# Patient Record
Sex: Female | Born: 1990 | Race: Black or African American | Hispanic: No | Marital: Single | Smoking: Never smoker
Health system: Southern US, Community
[De-identification: ages and names within clinical notes are randomized; demographics above are authoritative.]

## PROBLEM LIST (undated history)

## (undated) DIAGNOSIS — Z789 Other specified health status: Secondary | ICD-10-CM

## (undated) HISTORY — PX: NO PAST SURGERIES: SHX2092

---
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
## 2010-10-08 IMAGING — CR DG SHOULDER 3+V*R*
1 series · 4 of 4 positions shown · non-contrast
Comparison: none

REASON FOR EXAM: pain
COMMENTS:   May transport without cardiac monitor

[Series 1: view not recorded · 0.17mm/px · 4 of 4 slices shown]
[im 1/4]
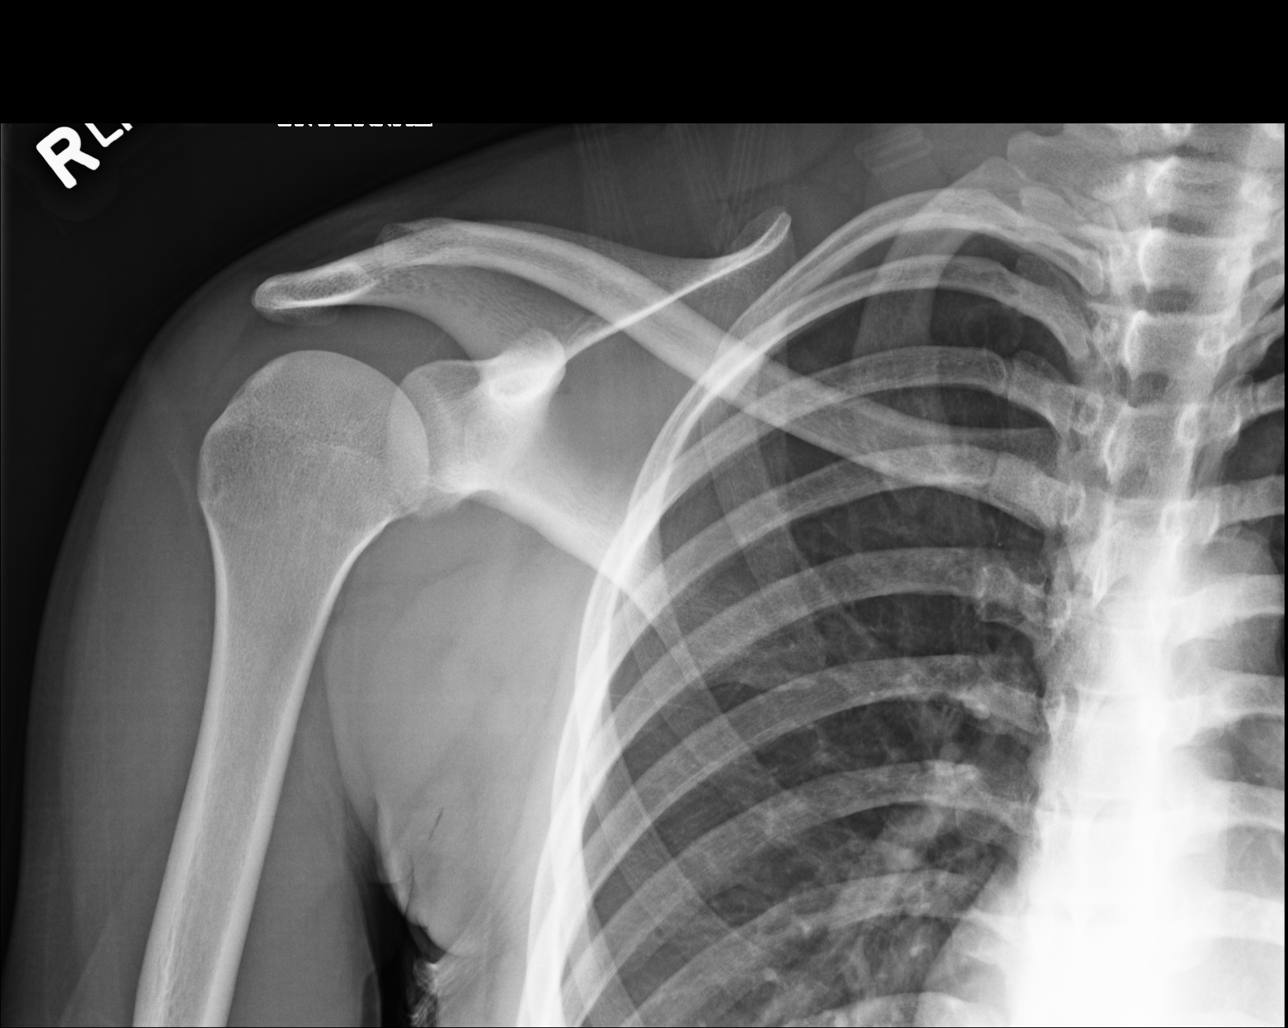
[im 2/4]
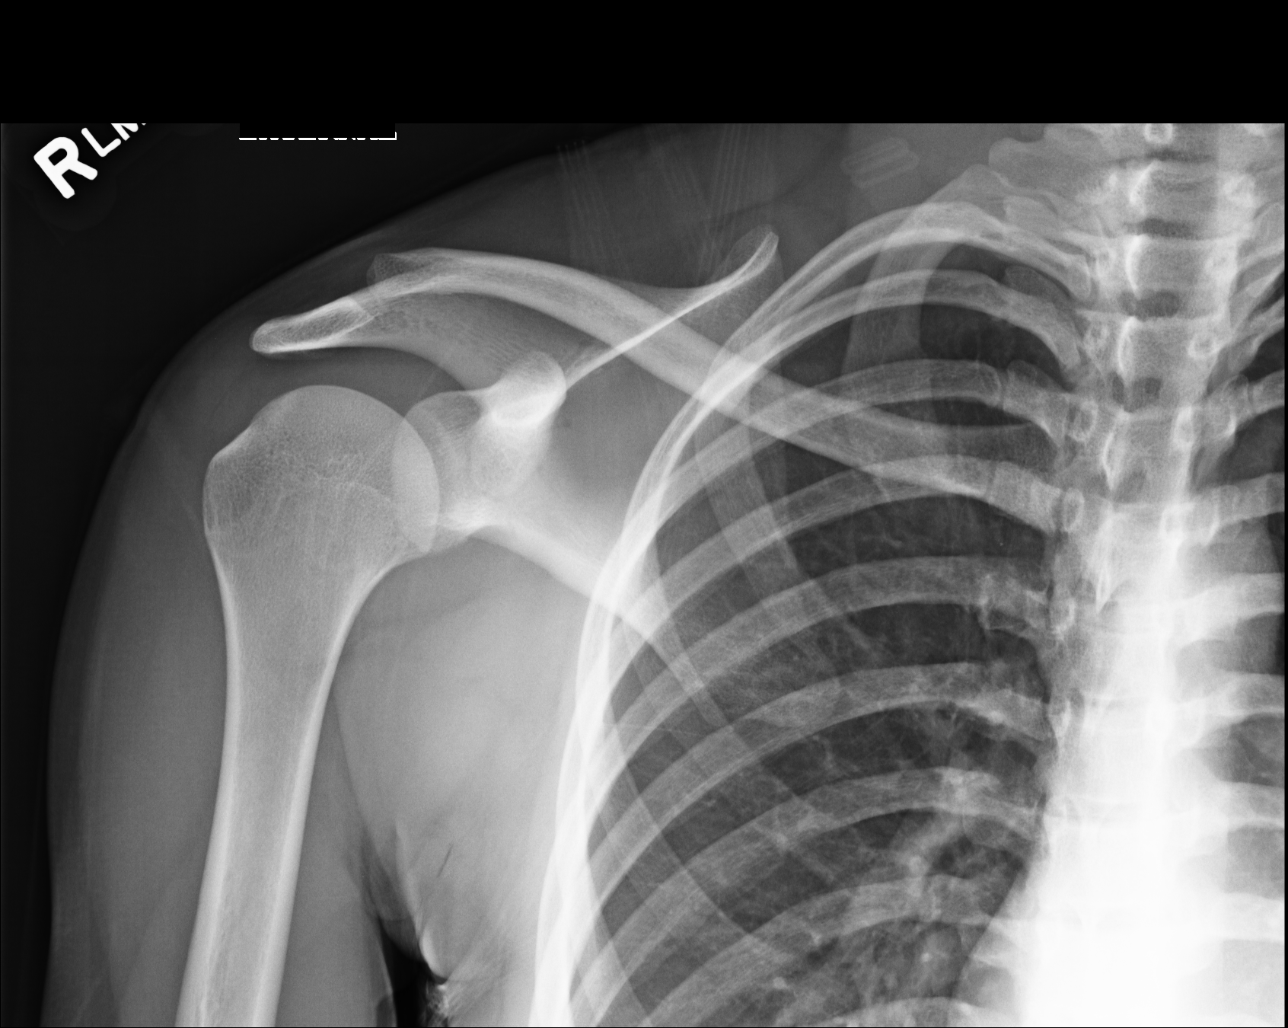
[im 3/4]
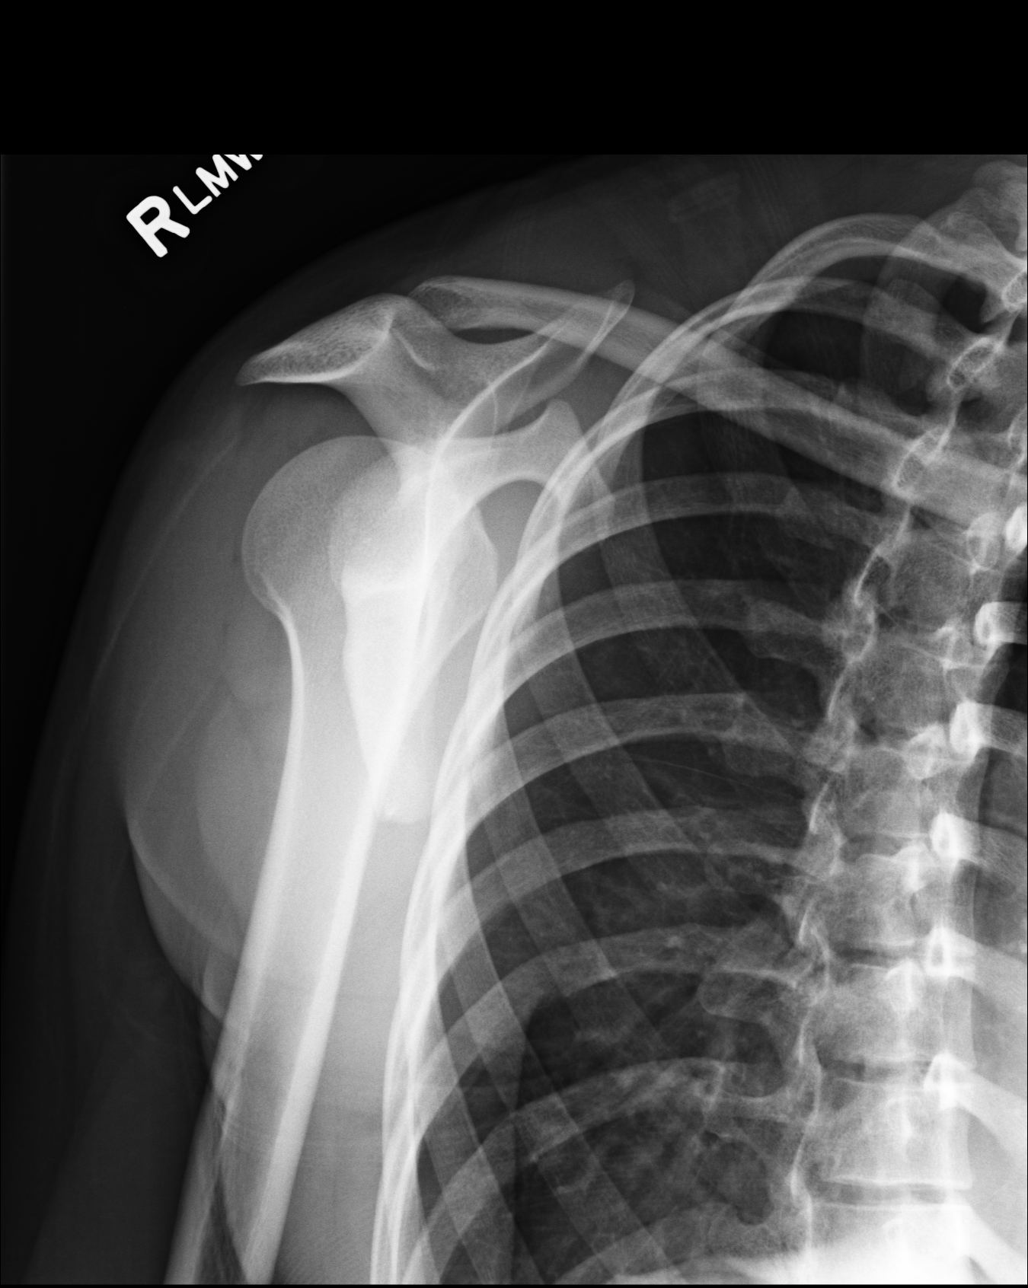
[im 4/4]
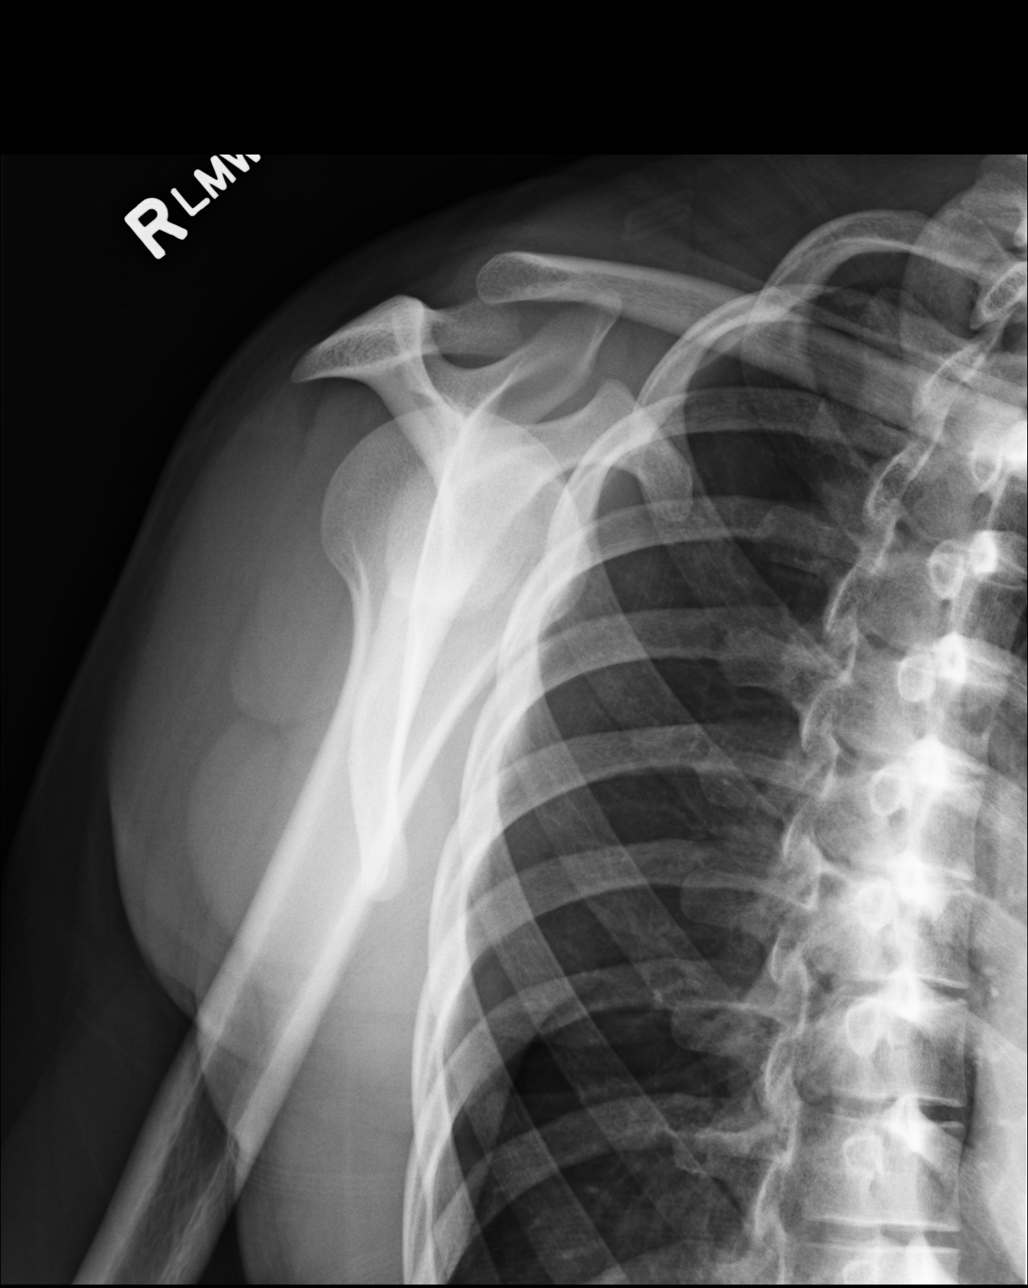

[4 of 4 positions shown; findings below may reference images not displayed]

PROCEDURE:     DXR - DXR SHOULDER RIGHT COMPLETE  - September 22, 2009  [DATE]

RESULT:     No fracture, dislocation or other acute bony abnormality is
identified. The status of the acromioclavicular joint is uncertain. If the
patient has symptomatology related to the acromioclavicular joint, then
bilateral views with and without weight bearing is recommended.
IMPRESSION: 1.  No fracture or dislocation about the shoulder joint is seen.
2.  If the patient is symptomatic at the acromioclavicular joint, then
bilateral views of the acromioclavicular joints with and without weight
bearing would be recommended.

## 2011-03-25 ENCOUNTER — Emergency Department: Payer: Self-pay | Admitting: Emergency Medicine

## 2011-05-05 ENCOUNTER — Emergency Department: Payer: Self-pay | Admitting: Emergency Medicine

## 2012-02-05 ENCOUNTER — Emergency Department: Payer: Self-pay | Admitting: Emergency Medicine

## 2012-07-16 ENCOUNTER — Emergency Department: Payer: Self-pay | Admitting: Emergency Medicine

## 2017-06-12 ENCOUNTER — Emergency Department: Payer: No Typology Code available for payment source

## 2017-06-12 ENCOUNTER — Emergency Department
Admission: EM | Admit: 2017-06-12 | Discharge: 2017-06-12 | Disposition: A | Discharge status: Home / Self Care | Payer: No Typology Code available for payment source | Attending: Emergency Medicine | Admitting: Emergency Medicine

## 2017-06-12 ENCOUNTER — Encounter: Payer: Self-pay | Admitting: Emergency Medicine

## 2017-06-12 ENCOUNTER — Other Ambulatory Visit: Payer: Self-pay

## 2017-06-12 DIAGNOSIS — S32009A Unspecified fracture of unspecified lumbar vertebra, initial encounter for closed fracture: Secondary | ICD-10-CM

## 2017-06-12 DIAGNOSIS — Y939 Activity, unspecified: Secondary | ICD-10-CM | POA: Insufficient documentation

## 2017-06-12 DIAGNOSIS — Y999 Unspecified external cause status: Secondary | ICD-10-CM | POA: Insufficient documentation

## 2017-06-12 DIAGNOSIS — S3992XA Unspecified injury of lower back, initial encounter: Secondary | ICD-10-CM | POA: Diagnosis present

## 2017-06-12 DIAGNOSIS — Y929 Unspecified place or not applicable: Secondary | ICD-10-CM | POA: Diagnosis not present

## 2017-06-12 DIAGNOSIS — S32029A Unspecified fracture of second lumbar vertebra, initial encounter for closed fracture: Secondary | ICD-10-CM | POA: Diagnosis not present

## 2017-06-12 MED ORDER — IBUPROFEN 800 MG PO TABS: 800.0000 mg | ORAL_TABLET | Freq: Three times a day (TID) | ORAL | 0 refills | Status: DC | PRN

## 2017-06-12 MED ORDER — CYCLOBENZAPRINE HCL 10 MG PO TABS: 10.0000 mg | ORAL_TABLET | Freq: Three times a day (TID) | ORAL | 0 refills | Status: DC | PRN

## 2017-06-12 MED ORDER — TRAMADOL HCL 50 MG PO TABS: 50.0000 mg | ORAL_TABLET | Freq: Four times a day (QID) | ORAL | 0 refills | Status: DC | PRN
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
## 2017-06-12 NOTE — ED Notes (Signed)
POC preg test negative.  

## 2017-06-13 LAB — POCT PREGNANCY, URINE: Preg Test, Ur: NEGATIVE

## 2018-05-29 ENCOUNTER — Inpatient Hospital Stay
Admission: EM | Admit: 2018-05-29 | Discharge: 2018-05-31 | DRG: 872 | Disposition: A | Discharge status: Home / Self Care | Payer: Self-pay | Attending: Internal Medicine | Admitting: Internal Medicine

## 2018-05-29 ENCOUNTER — Encounter: Payer: Self-pay | Admitting: Internal Medicine

## 2018-05-29 ENCOUNTER — Other Ambulatory Visit: Payer: Self-pay

## 2018-05-29 DIAGNOSIS — L03312 Cellulitis of back [any part except buttock]: Secondary | ICD-10-CM | POA: Diagnosis present

## 2018-05-29 DIAGNOSIS — D638 Anemia in other chronic diseases classified elsewhere: Secondary | ICD-10-CM | POA: Diagnosis present

## 2018-05-29 DIAGNOSIS — L0501 Pilonidal cyst with abscess: Secondary | ICD-10-CM | POA: Diagnosis present

## 2018-05-29 DIAGNOSIS — Z79899 Other long term (current) drug therapy: Secondary | ICD-10-CM

## 2018-05-29 DIAGNOSIS — A419 Sepsis, unspecified organism: Principal | ICD-10-CM | POA: Diagnosis present

## 2018-05-29 DIAGNOSIS — L0390 Cellulitis, unspecified: Secondary | ICD-10-CM | POA: Diagnosis present

## 2018-05-29 HISTORY — DX: Other specified health status: Z78.9

## 2018-05-29 LAB — CBC
HEMATOCRIT: 34.7 % — AB (ref 36.0–46.0)
HEMOGLOBIN: 11.7 g/dL — AB (ref 12.0–15.0)
MCH: 31 pg (ref 26.0–34.0)
MCHC: 33.7 g/dL (ref 30.0–36.0)
MCV: 92 fL (ref 80.0–100.0)
Platelets: 322 10*3/uL (ref 150–400)
RBC: 3.77 MIL/uL — AB (ref 3.87–5.11)
RDW: 12.6 % (ref 11.5–15.5)
WBC: 15.2 10*3/uL — AB (ref 4.0–10.5)
nRBC: 0 % (ref 0.0–0.2)

## 2018-05-29 LAB — COMPREHENSIVE METABOLIC PANEL
ALBUMIN: 4 g/dL (ref 3.5–5.0)
ALK PHOS: 63 U/L (ref 38–126)
ALT: 34 U/L (ref 0–44)
ANION GAP: 8 (ref 5–15)
AST: 38 U/L (ref 15–41)
BILIRUBIN TOTAL: 0.4 mg/dL (ref 0.3–1.2)
BUN: 8 mg/dL (ref 6–20)
CALCIUM: 9.1 mg/dL (ref 8.9–10.3)
CO2: 26 mmol/L (ref 22–32)
CREATININE: 0.84 mg/dL (ref 0.44–1.00)
Chloride: 105 mmol/L (ref 98–111)
GFR calc Af Amer: >60 mL/min (ref >60)
GFR calc non Af Amer: >60 mL/min (ref >60)
GLUCOSE: 100 mg/dL — AB (ref 70–99)
Potassium: 3.7 mmol/L (ref 3.5–5.1)
Sodium: 139 mmol/L (ref 135–145)
TOTAL PROTEIN: 8.1 g/dL (ref 6.5–8.1)

## 2018-05-29 LAB — CG4 I-STAT (LACTIC ACID): Lactic Acid, Venous: 0.66 mmol/L (ref 0.5–1.9)

## 2018-05-29 MED ORDER — VANCOMYCIN HCL IN DEXTROSE 1-5 GM/200ML-% IV SOLN
1000.0000 mg | Freq: Once | INTRAVENOUS | Status: AC
  Administered 2018-05-29: 1000 mg via INTRAVENOUS
  Filled 2018-05-29: qty 200

## 2018-05-29 MED ORDER — FENTANYL CITRATE (PF) 100 MCG/2ML IJ SOLN
50.0000 ug | Freq: Once | INTRAMUSCULAR | Status: AC
  Administered 2018-05-29: 50 ug via INTRAVENOUS
  Filled 2018-05-29: qty 2

## 2018-05-29 MED ORDER — IBUPROFEN 400 MG PO TABS
800.0000 mg | ORAL_TABLET | Freq: Three times a day (TID) | ORAL | Status: DC | PRN
  Administered 2018-05-30 (×2): 800 mg via ORAL
  Filled 2018-05-29 (×2): qty 2

## 2018-05-29 MED ORDER — ACETAMINOPHEN 325 MG PO TABS
650.0000 mg | ORAL_TABLET | Freq: Four times a day (QID) | ORAL | Status: DC | PRN
  Administered 2018-05-31: 650 mg via ORAL
  Filled 2018-05-29: qty 2

## 2018-05-29 MED ORDER — ENOXAPARIN SODIUM 40 MG/0.4ML ~~LOC~~ SOLN
40.0000 mg | SUBCUTANEOUS | Status: DC
  Administered 2018-05-30: 40 mg via SUBCUTANEOUS
  Filled 2018-05-29: qty 0.4

## 2018-05-29 MED ORDER — CLINDAMYCIN PHOSPHATE 600 MG/50ML IV SOLN
600.0000 mg | Freq: Three times a day (TID) | INTRAVENOUS | Status: DC
  Administered 2018-05-30 – 2018-05-31 (×4): 600 mg via INTRAVENOUS
  Filled 2018-05-29 (×6): qty 50

## 2018-05-29 MED ORDER — TRAMADOL HCL 50 MG PO TABS
50.0000 mg | ORAL_TABLET | Freq: Four times a day (QID) | ORAL | Status: DC | PRN
  Administered 2018-05-30 – 2018-05-31 (×4): 50 mg via ORAL
  Filled 2018-05-29 (×4): qty 1

## 2018-05-29 MED ORDER — ACETAMINOPHEN 650 MG RE SUPP: 650.0000 mg | Freq: Four times a day (QID) | RECTAL | Status: DC | PRN

## 2018-05-29 MED ORDER — SODIUM CHLORIDE 0.9 % IV BOLUS: 1000.0000 mL | Freq: Once | INTRAVENOUS | Status: DC

## 2018-05-29 MED ORDER — ONDANSETRON HCL 4 MG PO TABS: 4.0000 mg | ORAL_TABLET | Freq: Four times a day (QID) | ORAL | Status: DC | PRN

## 2018-05-29 MED ORDER — ACETAMINOPHEN 325 MG PO TABS
650.0000 mg | ORAL_TABLET | Freq: Once | ORAL | Status: AC
  Administered 2018-05-29: 650 mg via ORAL
  Filled 2018-05-29: qty 2

## 2018-05-29 MED ORDER — ONDANSETRON HCL 4 MG/2ML IJ SOLN
4.0000 mg | Freq: Once | INTRAMUSCULAR | Status: AC
  Administered 2018-05-29: 4 mg via INTRAVENOUS
  Filled 2018-05-29: qty 2

## 2018-05-29 MED ORDER — ONDANSETRON HCL 4 MG/2ML IJ SOLN
4.0000 mg | Freq: Four times a day (QID) | INTRAMUSCULAR | Status: DC | PRN
  Administered 2018-05-30 – 2018-05-31 (×2): 4 mg via INTRAVENOUS
  Filled 2018-05-29 (×2): qty 2

## 2018-05-29 MED ORDER — SODIUM CHLORIDE 0.9 % IV SOLN
1.0000 g | Freq: Once | INTRAVENOUS | Status: AC
  Administered 2018-05-29: 1 g via INTRAVENOUS
  Filled 2018-05-29: qty 10

## 2018-05-29 NOTE — ED Notes (Signed)
ED TO INPATIENT HANDOFF REPORT  Name/Age/Gender Cindy Coleman 27 y.o. female  Code Status   Home/SNF/Other home  Chief Complaint abcess  Level of Care/Admitting Diagnosis ED Disposition    ED Disposition Condition Comment   Admit  Hospital Area: [facility] [[id number]]  Level of Care: Med-Surg [16]  Diagnosis: Sepsis [facility]) [[id number]]  Admitting Physician: Oralia Manis [[id number]]  Attending Physician: Oralia Manis [phone]  Estimated length of stay: past midnight tomorrow  Certification:: I certify this patient will need inpatient services for at least 2 midnights  PT Class (Do Not Modify): Inpatient [101]  PT Acc Code (Do Not Modify): Private [1]       Medical History Past Medical History:  Diagnosis Date  . Patient denies medical problems     Allergies No Known Allergies  IV Location/Drains/Wounds Patient Lines/Drains/Airways Status   Active Line/Drains/Airways    Name:   Placement date:   Placement time:   Site:   Days:   Peripheral IV 05/29/18 Left Forearm   05/29/18    2120    Forearm   less than 1   Peripheral IV 05/29/18 Left Antecubital   05/29/18    2121    Antecubital   less than 1          Labs/Imaging Results for orders placed or performed during the hospital encounter of 05/29/18 (from the past 48 hour(s))  CBC     Status: Abnormal   Collection Time: 05/29/18  7:40 PM  Result Value Ref Range   WBC 15.2 (H) 4.0 - 10.5 K/uL   RBC 3.77 (L) 3.87 - 5.11 MIL/uL   Hemoglobin 11.7 (L) 12.0 - 15.0 g/dL   HCT 10.2 (L) 72.5 - 36.6 %   MCV 92.0 80.0 - 100.0 fL   MCH 31.0 26.0 - 34.0 pg   MCHC 33.7 30.0 - 36.0 g/dL   RDW 44.0 34.7 - 42.5 %   Platelets 322 150 - 400 K/uL   nRBC 0.0 0.0 - 0.2 %    Comment: Performed at [facility], [address] [place], [place] [address]  Comprehensive metabolic panel     Status: Abnormal   Collection Time: 05/29/18  7:40 PM  Result Value Ref Range   Sodium 139 135 - 145 mmol/L    Potassium 3.7 3.5 - 5.1 mmol/L   Chloride 105 98 - 111 mmol/L   CO2 26 22 - 32 mmol/L   Glucose, Bld 100 (H) 70 - 99 mg/dL   BUN 8 6 - 20 mg/dL   Creatinine, Ser 7.56 0.44 - 1.00 mg/dL   Calcium 9.1 8.9 - 43.3 mg/dL   Total Protein 8.1 6.5 - 8.1 g/dL   Albumin 4.0 3.5 - 5.0 g/dL   AST 38 15 - 41 U/L   ALT 34 0 - 44 U/L   Alkaline Phosphatase 63 38 - 126 U/L   Total Bilirubin 0.4 0.3 - 1.2 mg/dL   GFR calc non Af Amer >60 >60 mL/min   GFR calc Af Amer >60 >60 mL/min   Anion gap 8 5 - 15    Comment: Performed at [facility], [address] [place], [place] [address]  CG4 I-STAT (Lactic acid)     Status: None   Collection Time: 05/29/18  7:48 PM  Result Value Ref Range   Lactic Acid, Venous 0.66 0.5 - 1.9 mmol/L   No results found.  Pending Labs [employer] (From admission, onward)    Start  Ordered   05/29/18 2032  Blood Culture (routine x 2)  BLOOD CULTURE X 2,   STAT     05/29/18 2031   05/29/18 2032  Urinalysis, Routine w reflex microscopic  ONCE - STAT,   STAT     05/29/18 2031   Signed and Held  HIV antibody (Routine Testing)  Once,   R     Signed and Held   Signed and Held  CBC  (enoxaparin (LOVENOX)    CrCl >/= 30 ml/min)  Once,   R    Comments:  Baseline for enoxaparin therapy IF NOT ALREADY DRAWN.  Notify MD if PLT < 100 K.    Signed and Held   Signed and Held  Creatinine, serum  (enoxaparin (LOVENOX)    CrCl >/= 30 ml/min)  Once,   R    Comments:  Baseline for enoxaparin therapy IF NOT ALREADY DRAWN.    Signed and Held   Signed and Held  Creatinine, serum  (enoxaparin (LOVENOX)    CrCl >/= 30 ml/min)  Weekly,   R    Comments:  while on enoxaparin therapy    Signed and Held   Signed and Held  Basic metabolic panel  Tomorrow morning,   R     Signed and Held   Signed and Held  CBC  Tomorrow morning,   R     Signed and Held          Vitals/Pain Today's Vitals   05/29/18 1933 05/29/18 1938 05/29/18 2131  BP: (!) 144/87  119/75  Pulse:  (!) 110  (!) 112  Resp: (!) 24  (!) 22  Temp: (!) 101.6 F (38.7 C)  100.2 F (37.9 C)  TempSrc: Oral    SpO2: 100%  100%  Weight: 65.8 kg    Height: 5\' 8"  (1.727 m)    PainSc:  10-Worst pain ever     Isolation Precautions No active isolations  Medications Medications  cefTRIAXone (ROCEPHIN) 1 g in sodium chloride 0.9 % 100 mL IVPB (1 g Intravenous New Bag/Given 05/29/18 2123)  vancomycin (VANCOCIN) IVPB 1000 mg/200 mL premix (1,000 mg Intravenous New Bag/Given 05/29/18 2123)  sodium chloride 0.9 % bolus 1,000 mL (has no administration in time range)  acetaminophen (TYLENOL) tablet 650 mg (650 mg Oral Given 05/29/18 1942)  fentaNYL (SUBLIMAZE) injection 50 mcg (50 mcg Intravenous Given 05/29/18 2118)  ondansetron (ZOFRAN) injection 4 mg (4 mg Intravenous Given 05/29/18 2118)    Mobility independent

## 2018-05-29 NOTE — Progress Notes (Signed)
CODE SEPSIS - PHARMACY COMMUNICATION  **Broad Spectrum Antibiotics should be administered within 1 hour of Sepsis diagnosis**  Time Code Sepsis Called/Page Received: 20:31  Antibiotics Ordered: Ceftriaxone, Vancomycin  Time of 1st antibiotic administration: 21:23  Additional action taken by pharmacy:   If necessary, Name of Provider/Nurse Contacted:     Stormy CardKatsoudas,Carolena Fairbank K, [facility] HospitalRPH Clinical Pharmacist  05/29/2018  9:57 PM

## 2018-05-29 NOTE — ED Provider Notes (Signed)
[facility] Emergency Department Provider Note  ____________________________________________  Time seen: Approximately 8:38 PM  I have reviewed the triage vital signs and the nursing notes.   HISTORY  Chief Complaint Abscess   HPI Cindy Coleman is a 27 y.o. female with no significant past medical history who presents for evaluation of abscess and fever.  Patient had a pilonidal abscess that was I&D at [facility] 2 days ago.  She was sent home on Keflex.  She reports that the pain in her back is gotten progressively worse.  The pain is sharp, severe, constant and nonradiating.  Today she started to have fevers at home.  She has had nausea but no vomiting, no diarrhea, no abdominal pain, no chest pain, no URI symptoms, no shortness of breath, no dysuria or hematuria.  PMH None-reviewed  Prior to Admission medications   Medication Sig Start Date End Date Taking? Authorizing Provider  cyclobenzaprine (FLEXERIL) 10 MG tablet Take 1 tablet (10 mg total) by mouth 3 (three) times daily as needed. 06/12/17   Joni Reining, PA-C  ibuprofen (ADVIL,MOTRIN) 800 MG tablet Take 1 tablet (800 mg total) by mouth every 8 (eight) hours as needed for moderate pain. 06/12/17   Joni Reining, PA-C  traMADol (ULTRAM) 50 MG tablet Take 1 tablet (50 mg total) by mouth every 6 (six) hours as needed. 06/12/17 06/12/18  Joni Reining, PA-C    Allergies Patient has no known allergies.  No family history on file.  Social History Social History   Tobacco Use  . Smoking status: Never Smoker  . Smokeless tobacco: Never Used  Substance Use Topics  . Alcohol use: Yes    Comment: occasional  . Drug use: No    Review of Systems  Constitutional: + fever. Eyes: Negative for visual changes. ENT: Negative for sore throat. Neck: No neck pain  Cardiovascular: Negative for chest pain. Respiratory: Negative for shortness of breath. Gastrointestinal: Negative for abdominal pain,  vomiting or diarrhea. Genitourinary: Negative for dysuria. Musculoskeletal: Negative for back pain. Skin: Negative for rash. + abscess Neurological: Negative for headaches, weakness or numbness. Psych: No SI or HI  ____________________________________________   PHYSICAL EXAM:  VITAL SIGNS: ED Triage Vitals  Enc Vitals Group     BP 05/29/18 1933 (!) 144/87     Pulse Rate 05/29/18 1933 (!) 110     Resp 05/29/18 1933 (!) 24     Temp 05/29/18 1933 (!) 101.6 F (38.7 C)     Temp Source 05/29/18 1933 Oral     SpO2 05/29/18 1933 100 %     Weight 05/29/18 1933 145 lb (65.8 kg)     Height 05/29/18 1933 5\' 8"  (1.727 m)     Head Circumference --      Peak Flow --      Pain Score 05/29/18 1938 10     Pain Loc --      Pain Edu? --      Excl. in GC? --     Constitutional: Alert and oriented. Well appearing and in no apparent distress. HEENT:      Head: Normocephalic and atraumatic.         Eyes: Conjunctivae are normal. Sclera is non-icteric.       Mouth/Throat: Mucous membranes are moist.       Neck: Supple with no signs of meningismus. Cardiovascular: Tachycardic with regular rhythm. No murmurs, gallops, or rubs. 2+ symmetrical distal pulses are present in all extremities. No JVD.  Respiratory: Normal respiratory effort. Lungs are clear to auscultation bilaterally. No wheezes, crackles, or rhonchi.  Gastrointestinal: Soft, non tender, and non distended with positive bowel sounds. No rebound or guarding. Back: there is small area which was previously drained, no fluctuance, there is significant induration, erythema, warmth, and tenderness that is spreading from the midline where the abscess was up to the right lower back Musculoskeletal: Nontender with normal range of motion in all extremities. No edema, cyanosis, or erythema of extremities. Neurologic: Normal speech and language. Face is symmetric. Moving all extremities. No gross focal neurologic deficits are appreciated. Skin: Skin  is warm, dry and intact. No rash noted. Psychiatric: Mood and affect are normal. Speech and behavior are normal.  ____________________________________________   LABS (all labs ordered are listed, but only abnormal results are displayed)  Labs Reviewed  CBC - Abnormal; Notable for the following components:      Result Value   WBC 15.2 (*)    RBC 3.77 (*)    Hemoglobin 11.7 (*)    HCT 34.7 (*)    All other components within normal limits  COMPREHENSIVE METABOLIC PANEL - Abnormal; Notable for the following components:   Glucose, Bld 100 (*)    All other components within normal limits  CULTURE, BLOOD (ROUTINE X 2)  CULTURE, BLOOD (ROUTINE X 2)  URINALYSIS, ROUTINE W REFLEX MICROSCOPIC  CG4 I-STAT (LACTIC ACID)  I-STAT BETA HCG BLOOD, ED ([facility], [facility], [facility] ONLY)   ____________________________________________  EKG  none  ____________________________________________  RADIOLogy  none  ____________________________________________   PROCEDURES  Procedure(s) performed: None Procedures Critical Care performed: yes  CRITICAL CARE Performed by: Nita Sickle  ?  Total critical care time: 35 min  Critical care time was exclusive of separately billable procedures and treating other patients.  Critical care was necessary to treat or prevent imminent or life-threatening deterioration.  Critical care was time spent personally by me on the following activities: development of treatment plan with patient and/or surrogate as well as nursing, discussions with consultants, evaluation of patient's response to treatment, examination of patient, obtaining history from patient or surrogate, ordering and performing treatments and interventions, ordering and review of laboratory studies, ordering and review of radiographic studies, pulse oximetry and re-evaluation of patient's condition.  ____________________________________________   INITIAL IMPRESSION / ASSESSMENT AND PLAN / ED  COURSE   27 y.o. female with no significant past medical history who presents for evaluation of abscess and fever.  Patient with a previously drained pilonidal abscess in the emergency department at [facility] 2 days ago who now presents with fever and extending cellulitis of her back. There is no drainable collection at this time on exam.  On arrival patient meets sepsis criteria with a fever of 101.6, tachycardic, tachypneic and white count of 15K.  Patient will be covered broadly with Rocephin and Vanco, will be given fluids and Tylenol.  Blood cultures and lactic are pending.  Patient will be admitted to the hospitalist service.      As part of my medical decision making, I reviewed the following data within the electronic MEDICAL RECORD NUMBER Nursing notes reviewed and incorporated, Labs reviewed , Old chart reviewed, Discussed with admitting physician , Notes from prior ED visits and [place] Controlled Substance Database    Pertinent labs & imaging results that were available during my care of the patient were reviewed by me and considered in my medical decision making (see chart for details).    ____________________________________________   FINAL CLINICAL IMPRESSION(S) /  ED DIAGNOSES  Final diagnoses:  Sepsis without acute organ dysfunction, due to unspecified organism (HCC)  Cellulitis of back except buttock      NEW MEDICATIONS STARTED DURING THIS VISIT:  ED Discharge Orders    None       Note:  This document was prepared using Dragon voice recognition software and may include unintentional dictation errors.    Nita SickleVeronese, [place], MD 05/29/18 2043

## 2018-05-29 NOTE — ED Triage Notes (Addendum)
Pt in with co abscess lower back for a week, [facility]. States started with fever today, states still draining and now feeling weak all over.

## 2018-05-29 NOTE — H&P (Signed)
[facility] -  at [facility]   PATIENT NAME: Cindy Coleman    MR#:  [id number]  DATE OF BIRTH:  05/23/1991  DATE OF ADMISSION:  05/29/2018  PRIMARY CARE PHYSICIAN: Patient, No Pcp Per   REQUESTING/REFERRING PHYSICIAN: Don PerkingVeronese, MD  CHIEF COMPLAINT:   Chief Complaint  Patient presents with  . Abscess    HISTORY OF PRESENT ILLNESS:  Cindy Coleman  is a 27 y.o. female who presents with chief complaint as above.  She presents to the ED with complaint of lower back tenderness.  She states that she went to [facility] 2 days ago and had a pilonidal abscess drained, and was sent home with Keflex.  Since that time her tenderness has not improved.  She states the abscess is no longer draining, but that her tenderness has not improved.  Here in the ED on presentation she is febrile, tachycardic, and has leukocytosis, meeting sepsis criteria.  Hospitalist were called for admission  PAST MEDICAL HISTORY:   Past Medical History:  Diagnosis Date  . Patient denies medical problems      PAST SURGICAL HISTORY:   Past Surgical History:  Procedure Laterality Date  . NO PAST SURGERIES       SOCIAL HISTORY:   Social History   Tobacco Use  . Smoking status: Never Smoker  . Smokeless tobacco: Never Used  Substance Use Topics  . Alcohol use: Yes    Comment: occasional     FAMILY HISTORY:  Family history reviewed and is non-contributory   DRUG ALLERGIES:  No Known Allergies  MEDICATIONS AT HOME:   Prior to Admission medications   Medication Sig Start Date End Date Taking? Authorizing Provider  cyclobenzaprine (FLEXERIL) 10 MG tablet Take 1 tablet (10 mg total) by mouth 3 (three) times daily as needed. 06/12/17   Joni ReiningSmith, Ronald K, PA-C  ibuprofen (ADVIL,MOTRIN) 800 MG tablet Take 1 tablet (800 mg total) by mouth every 8 (eight) hours as needed for moderate pain. 06/12/17   Joni ReiningSmith, Ronald K, PA-C  traMADol (ULTRAM) 50 MG tablet Take 1 tablet (50 mg  total) by mouth every 6 (six) hours as needed. 06/12/17 06/12/18  Joni ReiningSmith, Ronald K, PA-C    REVIEW OF SYSTEMS:  Review of Systems  Constitutional: Negative for chills, fever, malaise/fatigue and weight loss.  HENT: Negative for ear pain, hearing loss and tinnitus.   Eyes: Negative for blurred vision, double vision, pain and redness.  Respiratory: Negative for cough, hemoptysis and shortness of breath.   Cardiovascular: Negative for chest pain, palpitations, orthopnea and leg swelling.  Gastrointestinal: Negative for abdominal pain, constipation, diarrhea, nausea and vomiting.  Genitourinary: Negative for dysuria, frequency and hematuria.  Musculoskeletal: Negative for back pain, joint pain and neck pain.  Skin:       Erythema surrounding drained abscess  Neurological: Negative for dizziness, tremors, focal weakness and weakness.  Endo/Heme/Allergies: Negative for polydipsia. Does not bruise/bleed easily.  Psychiatric/Behavioral: Negative for depression. The patient is not nervous/anxious and does not have insomnia.      VITAL SIGNS:   Vitals:   05/29/18 1933  BP: (!) 144/87  Pulse: (!) 110  Resp: (!) 24  Temp: (!) 101.6 F (38.7 C)  TempSrc: Oral  SpO2: 100%  Weight: 65.8 kg  Height: 5\' 8"  (1.727 m)   Wt Readings from Last 3 Encounters:  05/29/18 65.8 kg  06/12/17 68 kg    PHYSICAL EXAMINATION:  Physical Exam  Vitals reviewed. Constitutional: She is oriented to person, place, and  time. She appears well-developed and well-nourished. No distress.  HENT:  Head: Normocephalic and atraumatic.  Mouth/Throat: Oropharynx is clear and moist.  Eyes: Pupils are equal, round, and reactive to light. Conjunctivae and EOM are normal. No scleral icterus.  Neck: Normal range of motion. Neck supple. No JVD present. No thyromegaly present.  Cardiovascular: Normal rate, regular rhythm and intact distal pulses. Exam reveals no gallop and no friction rub.  No murmur heard. Respiratory:  Effort normal and breath sounds normal. No respiratory distress. She has no wheezes. She has no rales.  GI: Soft. Bowel sounds are normal. She exhibits no distension. There is no tenderness.  Musculoskeletal: Normal range of motion. She exhibits no edema.  No arthritis, no gout  Lymphadenopathy:    She has no cervical adenopathy.  Neurological: She is alert and oriented to person, place, and time. No cranial nerve deficit.  No dysarthria, no aphasia  Skin: Skin is warm and dry. No rash noted. There is erythema (Surrounding abscess site, now drained.  There is persistent induration, though no fluctuation palpated.).  Psychiatric: She has a normal mood and affect. Her behavior is normal. Judgment and thought content normal.    LABORATORY PANEL:   CBC Recent Labs  Lab 05/29/18 1940  WBC 15.2*  HGB 11.7*  HCT 34.7*  PLT 322   ------------------------------------------------------------------------------------------------------------------  Chemistries  Recent Labs  Lab 05/29/18 1940  NA 139  K 3.7  CL 105  CO2 26  GLUCOSE 100*  BUN 8  CREATININE 0.84  CALCIUM 9.1  AST 38  ALT 34  ALKPHOS 63  BILITOT 0.4   ------------------------------------------------------------------------------------------------------------------  Cardiac Enzymes No results for input(s): TROPONINI in the last 168 hours. ------------------------------------------------------------------------------------------------------------------  RADIOLOGY:  No results found.  EKG:   Orders placed or performed during the hospital encounter of 05/29/18  . ED EKG 12-Lead  . ED EKG 12-Lead    IMPRESSION AND PLAN:  Principal Problem:   Sepsis (HCC) -due to cellulitis.  Lactic acid within normal limits.  Blood pressure stable.  IV antibiotics initiated in the ED.  Culture culture sent Active Problems:   Cellulitis -IV antibiotics as above.  She is ordered a dose of vancomycin and Rocephin in the ED.   Given that she was on Keflex outpatient, I will change her to clindamycin for subsequent doses.  Chart review performed and case discussed with ED provider. Labs, imaging and/or ECG reviewed by provider and discussed with patient/family. Management plans discussed with the patient and/or family.  DVT PROPHYLAXIS: SubQ lovenox   GI PROPHYLAXIS:  None  ADMISSION STATUS: Inpatient  CODE STATUS: Full  TOTAL TIME TAKING CARE OF THIS PATIENT: 45 minutes.   Json Koelzer FIELDING 05/29/2018, 8:56 PM  [facility] [place] [facility]  Office  [phone]  CC: Primary care physician; Patient, No Pcp Per  Note:  This document was prepared using Dragon voice recognition software and may include unintentional dictation errors.

## 2018-05-30 LAB — CBC
HCT: 30.1 % — ABNORMAL LOW (ref 36.0–46.0)
Hemoglobin: 9.9 g/dL — ABNORMAL LOW (ref 12.0–15.0)
MCH: 30.9 pg (ref 26.0–34.0)
MCHC: 32.9 g/dL (ref 30.0–36.0)
MCV: 94.1 fL (ref 80.0–100.0)
Platelets: 264 10*3/uL (ref 150–400)
RBC: 3.2 MIL/uL — ABNORMAL LOW (ref 3.87–5.11)
RDW: 12.5 % (ref 11.5–15.5)
WBC: 16.3 10*3/uL — ABNORMAL HIGH (ref 4.0–10.5)
nRBC: 0 % (ref 0.0–0.2)

## 2018-05-30 LAB — BASIC METABOLIC PANEL
Anion gap: 4 — ABNORMAL LOW (ref 5–15)
BUN: 9 mg/dL (ref 6–20)
CO2: 26 mmol/L (ref 22–32)
Calcium: 8.3 mg/dL — ABNORMAL LOW (ref 8.9–10.3)
Chloride: 110 mmol/L (ref 98–111)
Creatinine, Ser: 0.78 mg/dL (ref 0.44–1.00)
GFR calc Af Amer: >60 mL/min (ref >60)
Glucose, Bld: 109 mg/dL — ABNORMAL HIGH (ref 70–99)
POTASSIUM: 3.7 mmol/L (ref 3.5–5.1)
Sodium: 140 mmol/L (ref 135–145)

## 2018-05-30 NOTE — Care Management (Signed)
RNCM following for potential home IV antibiotics.

## 2018-05-30 NOTE — Progress Notes (Signed)
[facility] - [place] at [facility]   PATIENT NAME: Cindy Coleman    MR#:  [id number]  DATE OF BIRTH:  [birth year]  SUBJECTIVE:  CHIEF COMPLAINT:   Chief Complaint  Patient presents with  . Abscess   -Area of redness and induration noted on right posterior flank -High-grade fevers last night  REVIEW OF SYSTEMS:  Review of Systems  Constitutional: Positive for fever. Negative for chills.  HENT: Negative for congestion, ear discharge, hearing loss and nosebleeds.   Eyes: Negative for blurred vision and double vision.  Respiratory: Negative for cough, shortness of breath and wheezing.   Cardiovascular: Negative for chest pain and palpitations.  Gastrointestinal: Negative for abdominal pain, constipation, diarrhea, nausea and vomiting.  Genitourinary: Negative for dysuria.  Musculoskeletal: Positive for myalgias.  Neurological: Negative for dizziness, focal weakness, seizures, weakness and headaches.  Psychiatric/Behavioral: Negative for depression.    DRUG ALLERGIES:  No Known Allergies  VITALS:  Blood pressure 134/76, pulse 89, temperature 98.7 F (37.1 C), temperature source Oral, resp. rate 18, height 5\' 8"  (1.727 m), weight 65.8 kg, last menstrual period 04/29/2018, SpO2 100 %.  PHYSICAL EXAMINATION:  Physical Exam   GENERAL:  27 y.o.-year-old patient lying in the bed with no acute distress.  EYES: Pupils equal, round, reactive to light and accommodation. No scleral icterus. Extraocular muscles intact.  HEENT: Head atraumatic, normocephalic. Oropharynx and nasopharynx clear.  NECK:  Supple, no jugular venous distention. No thyroid enlargement, no tenderness.  LUNGS: Normal breath sounds bilaterally, no wheezing, rales,rhonchi or crepitation. No use of accessory muscles of respiration.  CARDIOVASCULAR: S1, S2 normal. No murmurs, rubs, or gallops.  ABDOMEN: Soft, nontender, nondistended. Bowel sounds present. No organomegaly or mass.  Posterior right  flank, there is an area of induration, firmness and erythema and tenderness noted.  Pilonidal cyst that is healed in the midline noted EXTREMITIES: No pedal edema, cyanosis, or clubbing.  NEUROLOGIC: Cranial nerves II through XII are intact. Muscle strength 5/5 in all extremities. Sensation intact. Gait not checked.  PSYCHIATRIC: The patient is alert and oriented x 3.  SKIN: No obvious rash, lesion, or ulcer.    LABORATORY PANEL:   CBC Recent Labs  Lab 05/30/18 0552  WBC 16.3*  HGB 9.9*  HCT 30.1*  PLT 264   ------------------------------------------------------------------------------------------------------------------  Chemistries  Recent Labs  Lab 05/29/18 1940 05/30/18 0552  NA 139 140  K 3.7 3.7  CL 105 110  CO2 26 26  GLUCOSE 100* 109*  BUN 8 9  CREATININE 0.84 0.78  CALCIUM 9.1 8.3*  AST 38  --   ALT 34  --   ALKPHOS 63  --   BILITOT 0.4  --    ------------------------------------------------------------------------------------------------------------------  Cardiac Enzymes No results for input(s): TROPONINI in the last 168 hours. ------------------------------------------------------------------------------------------------------------------  RADIOLOGY:  No results found.  EKG:  No orders found for this or any previous visit.  ASSESSMENT AND PLAN:   27 year old female with no significant past medical history other than smoking presents to hospital secondary to worsening lower back pain and tenderness  1.  Right flank cellulitis-likely source open pilonidal cyst in the midline.  It has been drained and well-healed now.  Area of tenderness and firmness noted lateral to the spine. -No fluctuant area noted. -Agree with clindamycin for now.  Monitor fevers and WBC.  If improved, will discharge on oral antibiotics tomorrow  2.  Sepsis-with fevers and elevated white count, secondary to above -Monitor WBC  3.  Anemia of chronic  disease-no active  bleeding.  No indication for transfusion at this time  4.  Tobacco use disorder-refused nicotine patch  5.  DVT prophylaxis-Lovenox   All the records are reviewed and case discussed with Care Management/Social Workerr. Management plans discussed with the patient, family and they are in agreement.  CODE STATUS: Full code  TOTAL TIME TAKING CARE OF THIS PATIENT: 38 minutes.   POSSIBLE D/C IN 1 to 2 DAYS, DEPENDING ON CLINICAL CONDITION.   Danilyn Cocke M.D on 05/30/2018 at 1:15 PM  Between 7am to 6pm - Pager - [phone]  After 6pm go to [email or web address] - password [email or web address]  [facility] [place] [facility]  Office  [phone]  CC: Primary care physician; Patient, No Pcp Per

## 2018-05-30 NOTE — Care Management Note (Signed)
Case Management Note  Patient Details  Name: Cindy Coleman MRN: [id number] Date of Birth: 04/15/1991  Subjective/Objective:                  RNCM met with patient and significant other regarding discharge planning. Home health list provided. No PCP.  They will let RNCM know if they would like referral [facility].   Action/Plan:  Indigent clinics provided along with community resources if needed. RNCM will follow.   Expected Discharge Date:                  Expected Discharge Plan:     In-House Referral:     Discharge planning Services  CM Consult, [facility]  Post Acute Care Choice:    Choice offered to:     DME Arranged:    DME Agency:  [place]:    [facility] Agency:     Status of Service:  In process, will continue to follow  If discussed at Long Length of Stay Meetings, dates discussed:    Additional Comments:  Marshell Garfinkel, RN 05/30/2018, 1:39 PM

## 2018-05-31 LAB — BASIC METABOLIC PANEL
Anion gap: 7 (ref 5–15)
BUN: 6 mg/dL (ref 6–20)
CO2: 26 mmol/L (ref 22–32)
Calcium: 8.4 mg/dL — ABNORMAL LOW (ref 8.9–10.3)
Chloride: 106 mmol/L (ref 98–111)
Creatinine, Ser: 0.94 mg/dL (ref 0.44–1.00)
GFR calc Af Amer: >60 mL/min (ref >60)
GFR calc non Af Amer: >60 mL/min (ref >60)
Glucose, Bld: 107 mg/dL — ABNORMAL HIGH (ref 70–99)
Potassium: 3.5 mmol/L (ref 3.5–5.1)
Sodium: 139 mmol/L (ref 135–145)

## 2018-05-31 LAB — CBC
HCT: 30 % — ABNORMAL LOW (ref 36.0–46.0)
Hemoglobin: 9.9 g/dL — ABNORMAL LOW (ref 12.0–15.0)
MCH: 30.7 pg (ref 26.0–34.0)
MCHC: 33 g/dL (ref 30.0–36.0)
MCV: 93.2 fL (ref 80.0–100.0)
Platelets: 293 10*3/uL (ref 150–400)
RBC: 3.22 MIL/uL — ABNORMAL LOW (ref 3.87–5.11)
RDW: 12.4 % (ref 11.5–15.5)
WBC: 13.1 10*3/uL — ABNORMAL HIGH (ref 4.0–10.5)
nRBC: 0 % (ref 0.0–0.2)

## 2018-05-31 LAB — HIV ANTIBODY (ROUTINE TESTING W REFLEX): HIV Screen 4th Generation wRfx: NONREACTIVE

## 2018-05-31 MED ORDER — RISAQUAD PO CAPS
1.0000 | ORAL_CAPSULE | Freq: Every day | ORAL | Status: DC
  Administered 2018-05-31: 1 via ORAL
  Filled 2018-05-31: qty 1

## 2018-05-31 MED ORDER — TRAMADOL HCL 50 MG PO TABS: 50.0000 mg | ORAL_TABLET | Freq: Four times a day (QID) | ORAL | 0 refills | Status: AC | PRN

## 2018-05-31 MED ORDER — BUTALBITAL-APAP-CAFFEINE 50-325-40 MG PO TABS
1.0000 | ORAL_TABLET | Freq: Once | ORAL | Status: AC
  Administered 2018-05-31: 1 via ORAL
  Filled 2018-05-31: qty 1

## 2018-05-31 MED ORDER — CLINDAMYCIN HCL 150 MG PO CAPS
300.0000 mg | ORAL_CAPSULE | Freq: Four times a day (QID) | ORAL | Status: DC
  Administered 2018-05-31: 300 mg via ORAL
  Filled 2018-05-31 (×2): qty 2

## 2018-05-31 MED ORDER — CLINDAMYCIN HCL 300 MG PO CAPS: 300.0000 mg | ORAL_CAPSULE | Freq: Four times a day (QID) | ORAL | 0 refills | Status: AC

## 2018-05-31 MED ORDER — RISAQUAD PO CAPS: 1.0000 | ORAL_CAPSULE | Freq: Every day | ORAL | 0 refills | Status: AC

## 2018-05-31 MED ORDER — IBUPROFEN 200 MG PO TABS: 800.0000 mg | ORAL_TABLET | ORAL | 0 refills | Status: AC | PRN

## 2018-05-31 NOTE — Progress Notes (Signed)
Pt expressing anger; verbally agressive because her "pain medication is late" Earlier on shift; during report, pt had c/o pain, I explained to pt that the previous nurse gave her tramadol at 1803 and she needed to give the medication time to work. Pt then asked me when her next dose for Tramadol was, I explained that Tramadol is PRN and it was Q 6 hours, it was due at 0003 as needed; pt stated "okay". At midnight, I checked on pt and pt was asleep, however, when NT went to get VS around 0105 pt stated that she needed to see me because she needed her pain medication and she had a fever. Temp was 100 oral. When I went to the room, pt was angry and verbally abusive towards me. She said "you knew I stay in pain....don't compare me to other patients...is SolicitorTiffany your director because I'll make sure to tell her about this...'' She went on and on until I said, I do not wake people up to give pain medications as needed and I am not technically supposed to give you Tylenol for a fever of 100, the orders read for fever >= to 101 but I have your Tramadol and I will also give you Tylenol, pt then said that I was trying to argue with her. [facility] has been notified.

## 2018-05-31 NOTE — Care Management (Signed)
Patient has transitioned to PO antibiotic per RN.  Patient has asked RNCM to send referral to [facility] and [facility] which I have done.  No other anticipated RNCM needs.

## 2018-05-31 NOTE — Discharge Summary (Signed)
[facility] - [place] at [facility]   PATIENT NAME: Cindy Coleman    MR#:  [id number]  DATE OF BIRTH:  12/02/1990  DATE OF ADMISSION:  05/29/2018   ADMITTING PHYSICIAN: Oralia Manisavid Willis, MD  DATE OF DISCHARGE: 05/31/18  PRIMARY CARE PHYSICIAN: Patient, No Pcp Per   ADMISSION DIAGNOSIS:   Cellulitis of back except buttock [L03.312] Sepsis without acute organ dysfunction, due to unspecified organism (HCC) [A41.9] Sepsis (HCC) [A41.9]  DISCHARGE DIAGNOSIS:   Principal Problem:   Sepsis (HCC) Active Problems:   Cellulitis   SECONDARY DIAGNOSIS:   Past Medical History:  Diagnosis Date  . Patient denies medical problems     HOSPITAL COURSE:   27 year old female with no significant past medical history other than smoking presents to hospital secondary to worsening lower back pain and tenderness  1.  Right flank cellulitis-likely source open pilonidal cyst in the midline.  It has been drained and well-healed now.  Area of tenderness and firmness noted lateral to the spine. -No fluctuant area noted. -improving with clindamycin for now.   - no further fever and improved WBC.   Change to oral clinda and discharge today  2.  Sepsis- secondary to above -resolved now  3.  Anemia of chronic disease-no active bleeding.  No indication for transfusion at this time  4.  Tobacco use disorder-refused nicotine patch   Discharge today   DISCHARGE CONDITIONS:   Guarded ' CONSULTS OBTAINED:   none  DRUG ALLERGIES:   No Known Allergies DISCHARGE MEDICATIONS:   Allergies as of 05/31/2018   No Known Allergies     Medication List    STOP taking these medications   cephALEXin 500 MG capsule Commonly known as:  KEFLEX     TAKE these medications   acidophilus Caps capsule Take 1 capsule by mouth daily. Start taking on:  06/01/2018   clindamycin 300 MG capsule Commonly known as:  CLEOCIN Take 1 capsule (300 mg total) by mouth every 6 (six) hours  for 10 days.   ibuprofen 200 MG tablet Commonly known as:  ADVIL,MOTRIN Take 4 tablets (800 mg total) by mouth every 4 (four) hours as needed for fever or mild pain.   traMADol 50 MG tablet Commonly known as:  ULTRAM Take 1 tablet (50 mg total) by mouth every 6 (six) hours as needed for severe pain. What changed:  reasons to take this        DISCHARGE INSTRUCTIONS:   1. PCP f/u in 1 week  DIET:   Regular diet  ACTIVITY:   Activity as tolerated and no driving for today  OXYGEN:   Home Oxygen: No.  Oxygen Delivery: room air  DISCHARGE LOCATION:   home   If you experience worsening of your admission symptoms, develop shortness of breath, life threatening emergency, suicidal or homicidal thoughts you must seek medical attention immediately by calling 911 or calling your MD immediately  if symptoms less severe.  You Must read complete instructions/literature along with all the possible adverse reactions/side effects for all the Medicines you take and that have been prescribed to you. Take any new Medicines after you have completely understood and accpet all the possible adverse reactions/side effects.   Please note  You were cared for by a hospitalist during your hospital stay. If you have any questions about your discharge medications or the care you received while you were in the hospital after you are discharged, you can call the unit and asked to speak with  the hospitalist on call if the hospitalist that took care of you is not available. Once you are discharged, your primary care physician will handle any further medical issues. Please note that NO REFILLS for any discharge medications will be authorized once you are discharged, as it is imperative that you return to your primary care physician (or establish a relationship with a primary care physician if you do not have one) for your aftercare needs so that they can reassess your need for medications and monitor your lab  values.    On the day of Discharge:  VITAL SIGNS:   Blood pressure 122/79, pulse 87, temperature 98.3 F (36.8 C), temperature source Oral, resp. rate 18, height 5\' 8"  (1.727 m), weight 65.8 kg, last menstrual period 04/29/2018, SpO2 100 %.  PHYSICAL EXAMINATION:    GENERAL:  27 y.o.-year-old patient lying in the bed with no acute distress.  EYES: Pupils equal, round, reactive to light and accommodation. No scleral icterus. Extraocular muscles intact.  HEENT: Head atraumatic, normocephalic. Oropharynx and nasopharynx clear.  NECK:  Supple, no jugular venous distention. No thyroid enlargement, no tenderness.  LUNGS: Normal breath sounds bilaterally, no wheezing, rales,rhonchi or crepitation. No use of accessory muscles of respiration.  CARDIOVASCULAR: S1, S2 normal. No murmurs, rubs, or gallops.  ABDOMEN: Soft, nontender, nondistended. Bowel sounds present. No organomegaly or mass.  Posterior right flank, there is an area of induration, firmness and erythema and tenderness noted.  Pilonidal cyst that is healed in the midline noted EXTREMITIES: No pedal edema, cyanosis, or clubbing.  NEUROLOGIC: Cranial nerves II through XII are intact. Muscle strength 5/5 in all extremities. Sensation intact. Gait not checked.  PSYCHIATRIC: The patient is alert and oriented x 3.  SKIN: No obvious rash, lesion, or ulcer.   DATA REVIEW:   CBC Recent Labs  Lab 05/31/18 0348  WBC 13.1*  HGB 9.9*  HCT 30.0*  PLT 293    Chemistries  Recent Labs  Lab 05/29/18 1940  05/31/18 0348  NA 139   < > 139  K 3.7   < > 3.5  CL 105   < > 106  CO2 26   < > 26  GLUCOSE 100*   < > 107*  BUN 8   < > 6  CREATININE 0.84   < > 0.94  CALCIUM 9.1   < > 8.4*  AST 38  --   --   ALT 34  --   --   ALKPHOS 63  --   --   BILITOT 0.4  --   --    < > = values in this interval not displayed.     Microbiology Results  Results for orders placed or performed during the hospital encounter of 05/29/18  Blood  Culture (routine x 2)     Status: None (Preliminary result)   Collection Time: 05/29/18  7:40 PM  Result Value Ref Range Status   Specimen Description BLOOD RIGHT ANTECUBITAL  Final   Special Requests   Final    BOTTLES DRAWN AEROBIC AND ANAEROBIC Blood Culture results may not be optimal due to an excessive volume of blood received in culture bottles   Culture   Final    NO GROWTH 2 DAYS Performed at [facility], [address] [place], [place] [address]    Report Status PENDING  Incomplete  Blood Culture (routine x 2)     Status: None (Preliminary result)   Collection Time: 05/29/18  9:24 PM  Result  Value Ref Range Status   Specimen Description BLOOD LEFT ANTECUBITAL  Final   Special Requests   Final    BOTTLES DRAWN AEROBIC AND ANAEROBIC Blood Culture results may not be optimal due to an excessive volume of blood received in culture bottles   Culture   Final    NO GROWTH 2 DAYS Performed at [facility], [address] [place], [place] [address]    Report Status PENDING  Incomplete    RADIOLOGY:  No results found.   Management plans discussed with the patient, family and they are in agreement.  CODE STATUS:     Code Status Orders  (From admission, onward)         Start     Ordered   05/29/18 2323  Full code  Continuous     05/29/18 2323        Code Status History    This patient has a current code status but no historical code status.      TOTAL TIME TAKING CARE OF THIS PATIENT: 38 minutes.    Enid Baas M.D on 05/31/2018 at 11:47 AM  Between 7am to 6pm - Pager - [phone]  After 6pm go to [email or web address] - Social research officer, government  [facility] [place] [facility]  Office  [phone]  CC: Primary care physician; Patient, No Pcp Per   Note: This dictation was prepared with Dragon dictation along with smaller phrase technology. Any transcriptional errors that result from this process are unintentional.

## 2018-05-31 NOTE — Progress Notes (Signed)
    Ms. Marland Kitchenja Quinn-Cuffie has been at [facility] caring for her friend Whitman Heroierra Shirkey who was admitted to the [facility] on 05/29/2018 for an acute medical condition and is being Discharged on  05/31/2018 . Marland Kitchen. So please excuse her from work for the above  Days.   Call Enid Baasadhika Azyah Flett  MD, [facility] at  [phone] with questions.  Enid BaasKALISETTI,Quinnetta Roepke M.D on 05/31/2018,at 11:45 AM  [facility] [address], [place] [id number]

## 2018-06-03 LAB — CULTURE, BLOOD (ROUTINE X 2)
CULTURE: NO GROWTH
CULTURE: NO GROWTH

## 2018-06-05 ENCOUNTER — Telehealth: Payer: Self-pay | Admitting: Adult Health Nurse Practitioner

## 2018-06-05 NOTE — Telephone Encounter (Signed)
Called patient to go over eligibility and she said she would call back at a better time.

## 2018-06-06 ENCOUNTER — Telehealth: Payer: Self-pay

## 2018-06-06 NOTE — Telephone Encounter (Signed)
-----   Message from [name] sent at 06/05/2018  5:06 PM EST ----- Regarding: RE: new referral Called patient on 12/10 to go over eligibility and she said she would call back at a better time. ----- Message ----- From: Smiley Housemanarter, Lorrie D Sent: 06/04/2018  12:47 PM EST To: Rodney LangtonBod Admin Subject: FW: new referral                                 ----- Message ----- From: Collie SiadJohnson, Angela, RN Sent: 05/31/2018  11:36 AM EST To: Neila GearLorrie D Carter, Betty J Kluttz Subject: new referral

## 2018-06-07 ENCOUNTER — Telehealth: Payer: Self-pay | Admitting: Pharmacy Technician

## 2018-06-07 NOTE — Telephone Encounter (Signed)
Provided patient with new patient packet to obtain [facility] services.  [facility] understands that [facility] must receive current financial documentation in order to determine eligibility and obtain on-going medication assistance.  Sherilyn DacostaBetty J. Nicolaas Savo Care Manager [facility]

## 2022-07-28 ENCOUNTER — Emergency Department: Payer: Self-pay

## 2022-07-28 ENCOUNTER — Encounter: Payer: Self-pay | Admitting: Emergency Medicine

## 2022-07-28 ENCOUNTER — Emergency Department
Admission: EM | Admit: 2022-07-28 | Discharge: 2022-07-28 | Disposition: A | Discharge status: Home / Self Care | Payer: Self-pay | Attending: Emergency Medicine | Admitting: Emergency Medicine

## 2022-07-28 ENCOUNTER — Other Ambulatory Visit: Payer: Self-pay

## 2022-07-28 DIAGNOSIS — J101 Influenza due to other identified influenza virus with other respiratory manifestations: Secondary | ICD-10-CM | POA: Insufficient documentation

## 2022-07-28 DIAGNOSIS — Z20822 Contact with and (suspected) exposure to covid-19: Secondary | ICD-10-CM | POA: Insufficient documentation

## 2022-07-28 LAB — CBC
HCT: 42 % (ref 36.0–46.0)
Hemoglobin: 14.3 g/dL (ref 12.0–15.0)
MCH: 30 pg (ref 26.0–34.0)
MCHC: 34 g/dL (ref 30.0–36.0)
MCV: 88.1 fL (ref 80.0–100.0)
Platelets: 197 10*3/uL (ref 150–400)
RBC: 4.77 MIL/uL (ref 3.87–5.11)
RDW: 12.6 % (ref 11.5–15.5)
WBC: 2.9 10*3/uL — ABNORMAL LOW (ref 4.0–10.5)
nRBC: 0 % (ref 0.0–0.2)

## 2022-07-28 LAB — RESP PANEL BY RT-PCR (RSV, FLU A&B, COVID)  RVPGX2
Influenza A by PCR: NEGATIVE
Influenza B by PCR: POSITIVE — AB
Resp Syncytial Virus by PCR: NEGATIVE
SARS Coronavirus 2 by RT PCR: NEGATIVE

## 2022-07-28 LAB — TROPONIN I (HIGH SENSITIVITY): Troponin I (High Sensitivity): 4 ng/L (ref <18)

## 2022-07-28 LAB — POC URINE PREG, ED
Preg Test, Ur: NEGATIVE
Preg Test, Ur: NEGATIVE

## 2022-07-28 LAB — BASIC METABOLIC PANEL
Anion gap: 12 (ref 5–15)
BUN: 10 mg/dL (ref 6–20)
CO2: 23 mmol/L (ref 22–32)
Calcium: 8.9 mg/dL (ref 8.9–10.3)
Chloride: 100 mmol/L (ref 98–111)
Creatinine, Ser: 0.81 mg/dL (ref 0.44–1.00)
GFR, Estimated: >60 mL/min (ref >60)
Glucose, Bld: 109 mg/dL — ABNORMAL HIGH (ref 70–99)
Potassium: 3.2 mmol/L — ABNORMAL LOW (ref 3.5–5.1)
Sodium: 135 mmol/L (ref 135–145)

## 2022-07-28 MED ORDER — SODIUM CHLORIDE 0.9 % IV BOLUS
1000.0000 mL | Freq: Once | INTRAVENOUS | Status: AC
  Administered 2022-07-28: 1000 mL via INTRAVENOUS

## 2022-07-28 MED ORDER — BENZONATATE 100 MG PO CAPS: 100.0000 mg | ORAL_CAPSULE | Freq: Three times a day (TID) | ORAL | 0 refills | Status: AC | PRN

## 2022-07-28 MED ORDER — ONDANSETRON 4 MG PO TBDP: 4.0000 mg | ORAL_TABLET | Freq: Three times a day (TID) | ORAL | 0 refills | Status: AC | PRN

## 2022-07-28 MED ORDER — ONDANSETRON HCL 4 MG/2ML IJ SOLN
4.0000 mg | Freq: Once | INTRAMUSCULAR | Status: AC
  Administered 2022-07-28: 4 mg via INTRAVENOUS
  Filled 2022-07-28: qty 2

## 2022-07-28 NOTE — ED Provider Notes (Signed)
[facility] Provider Note    Event Date/Time   First MD Initiated Contact with Patient 07/28/22 [phone]     (approximate)   History   Generalized Body Aches and Shortness of Breath   HPI  Cindy Coleman is a 32 y.o. female with no significant past medical history even though her chart shows a history of sepsis presents emergency department complaining of flulike symptoms.  Patient's body aches, weakness, some shortness of breath and chest pain along with fever.  Decreased appetite cough since last Friday.  States she started feeling bad Friday morning.  Felt fine on Thursday.  Today she got concerned as she got dizzy while in the shower.  Is that she fell like she could not breathe.  She denies any recent travel, no hormone replacement therapy, however patient is a smoker but only smokes marijuana.  States she has not had marijuana in over a week      Physical Exam   Triage Vital Signs: ED Triage Vitals  Enc Vitals Group     BP 07/28/22 0850 (!) 149/102     Pulse Rate 07/28/22 0850 (!) 102     Resp 07/28/22 0850 18     Temp 07/28/22 0850 98 F (36.7 C)     Temp Source 07/28/22 0850 Oral     SpO2 07/28/22 0850 100 %     Weight 07/28/22 0912 145 lb 1 oz (65.8 kg)     Height 07/28/22 0912 5\' 8"  (1.727 m)     Head Circumference --      Peak Flow --      Pain Score 07/28/22 0850 5     Pain Loc --      Pain Edu? --      Excl. in [place]? --     Most recent vital signs: Vitals:   07/28/22 0850  BP: (!) 149/102  Pulse: (!) 102  Resp: 18  Temp: 98 F (36.7 C)  SpO2: 100%     General: Awake, no distress.   CV:  Good peripheral perfusion.  Tachycardic and regular rhythm Resp:  Normal effort. Lungs cta Abd:  No distention.  Nontender Other:      ED Results / Procedures / Treatments   Labs (all labs ordered are listed, but only abnormal results are displayed) Labs Reviewed  RESP PANEL BY RT-PCR (RSV, FLU A&B, COVID)  RVPGX2 - Abnormal; Notable  for the following components:      Result Value   Influenza B by PCR POSITIVE (*)    All other components within normal limits  BASIC METABOLIC PANEL - Abnormal; Notable for the following components:   Potassium 3.2 (*)    Glucose, Bld 109 (*)    All other components within normal limits  CBC - Abnormal; Notable for the following components:   WBC 2.9 (*)    All other components within normal limits  POC URINE PREG, ED  POC URINE PREG, ED  TROPONIN I (HIGH SENSITIVITY)     EKG  EKG   RADIOLOGY Chest x-ray    PROCEDURES:   Procedures   MEDICATIONS ORDERED IN ED: Medications  sodium chloride 0.9 % bolus 1,000 mL (0 mLs Intravenous Stopped 07/28/22 1052)  ondansetron (ZOFRAN) injection 4 mg (4 mg Intravenous Given 07/28/22 0959)     IMPRESSION / MDM / ASSESSMENT AND PLAN / ED COURSE  I reviewed the triage vital signs and the nursing notes.  Differential diagnosis includes, but is not limited to, COVID, influenza, RSV, CAP, sepsis, dehydration  Patient's presentation is most consistent with acute complicated illness / injury requiring diagnostic workup.   Feel that sepsis is less likely at this time as patient is afebrile here in the ED and her heart rate is slightly elevated at 102.  However feel this may mostly be due to dehydration.  Chest x-ray was independently reviewed and interpreted by me as being negative for any acute abnormality patient's potassium is a little decreased at 3.2 will give her potassium p.o., WBC is depressed at 2.9 most likely indicating a viral entity.  EKG shows normal sinus rhythm, 93 bpm.  No STEMI, no QT prolongation  Plan at this time is to give patient 1 L normal saline IV, Zofran IV and recheck orthostatics after fluids.   Patient's respiratory panel returned positive for influenza.  Patient states she is feeling much better after the normal saline.  Feel that she is stable enough to be discharged.  She  has been sick for 3 days to be given Tamiflu.  She is to follow-up with her regular doctor if not improving 3 to 4 days.  She was given a prescription for Zofran ODT and Tessalon Perles.  She is to return emergency department if worsening.  She is given a work note and discharged stable condition.   FINAL CLINICAL IMPRESSION(S) / ED DIAGNOSES   Final diagnoses:  Influenza B     Rx / DC Orders   ED Discharge Orders          Ordered    benzonatate (TESSALON PERLES) 100 MG capsule  3 times daily PRN        07/28/22 1047    ondansetron (ZOFRAN-ODT) 4 MG disintegrating tablet  Every 8 hours PRN        07/28/22 1047             Note:  This document was prepared using Dragon voice recognition software and may include unintentional dictation errors.    Versie Starks, PA-C 07/28/22 1502    Carrie Mew, MD 07/29/22 [phone]

## 2022-07-28 NOTE — Discharge Instructions (Signed)
Use Tylenol and ibuprofen for fever/chills/body aches. Return to the emergency department if worsening, see your regular doctor if not improving in 3 days Take the cough medication as needed Take the nausea medication as needed Drink plenty of fluids.

## 2022-07-28 NOTE — ED Notes (Signed)
Pt also states she has not eaten in a week and sometimes it feels hard to breathe. Pt reassured that her oxygen sat was WNL.

## 2022-07-28 NOTE — ED Triage Notes (Addendum)
First nurse note: Pt to ED via POV from home. Pt ambulatory to triage. Pt reports body aches, weakness, SOB, CP, chills, fever, decreased appetite and cough since last Friday.
